# Patient Record
Sex: Male | Born: 1995 | Race: White | Hispanic: No | Marital: Single | Smoking: Never smoker
Health system: Southern US, Community
[De-identification: ages and names within clinical notes are randomized; demographics above are authoritative.]

## PROBLEM LIST (undated history)

## (undated) DIAGNOSIS — H539 Unspecified visual disturbance: Secondary | ICD-10-CM

## (undated) DIAGNOSIS — F909 Attention-deficit hyperactivity disorder, unspecified type: Secondary | ICD-10-CM

## (undated) DIAGNOSIS — R569 Unspecified convulsions: Secondary | ICD-10-CM

## (undated) HISTORY — PX: GASTROSTOMY TUBE PLACEMENT: SHX655

## (undated) HISTORY — DX: Unspecified convulsions: R56.9

## (undated) HISTORY — DX: Attention-deficit hyperactivity disorder, unspecified type: F90.9

## (undated) HISTORY — DX: Unspecified visual disturbance: H53.9

---
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
## 2016-01-05 ENCOUNTER — Other Ambulatory Visit: Payer: Self-pay | Admitting: Family

## 2016-01-05 DIAGNOSIS — G40319 Generalized idiopathic epilepsy and epileptic syndromes, intractable, without status epilepticus: Secondary | ICD-10-CM

## 2016-01-18 ENCOUNTER — Other Ambulatory Visit: Payer: Self-pay

## 2016-01-18 DIAGNOSIS — R6250 Unspecified lack of expected normal physiological development in childhood: Secondary | ICD-10-CM

## 2016-01-18 MED ORDER — LORAZEPAM 1 MG PO TABS: ORAL_TABLET | ORAL | 5 refills | Status: DC

## 2016-04-04 ENCOUNTER — Telehealth: Payer: Self-pay | Admitting: Pediatrics

## 2016-04-04 NOTE — Telephone Encounter (Signed)
[name] or [name] at [facility] needs to talk to you about a RX for Todd Murray please the number is [phone]

## 2016-04-18 NOTE — Telephone Encounter (Signed)
Failed Zantac/Nexium and omeprazole Unable to tolerate po--G tube dependant Unable to take capsules PA approved--[id number]--03/07/16 X 1 year

## 2016-04-22 ENCOUNTER — Telehealth: Payer: Self-pay | Admitting: Pediatrics

## 2016-04-22 ENCOUNTER — Telehealth (INDEPENDENT_AMBULATORY_CARE_PROVIDER_SITE_OTHER): Payer: Self-pay

## 2016-04-22 DIAGNOSIS — L03319 Cellulitis of trunk, unspecified: Principal | ICD-10-CM

## 2016-04-22 DIAGNOSIS — K9422 Gastrostomy infection: Secondary | ICD-10-CM

## 2016-04-22 MED ORDER — MUPIROCIN 2 % EX OINT: TOPICAL_OINTMENT | CUTANEOUS | 2 refills | Status: AC

## 2016-04-22 MED ORDER — CEPHALEXIN 250 MG/5ML PO SUSR: 500.0000 mg | Freq: Three times a day (TID) | ORAL | 0 refills | Status: AC

## 2016-04-22 NOTE — Addendum Note (Signed)
Addended by: Saul FordyceLOWE, CRYSTAL M on: 04/22/2016 04:39 PM   Modules accepted: Orders

## 2016-04-22 NOTE — Telephone Encounter (Signed)
[facility] home health nurse called today around 11:15 and left a message asking for a call back but did not say why.  I returned the call at 12:20 and had to leave a message.

## 2016-04-22 NOTE — Telephone Encounter (Signed)
I called Mom and told her that Dr Sharene SkeansHickling was out of the office today. I told her that I did not see a call from last week. I told her that I would write a letter and ask Dr Sharene SkeansHickling to sign it tomorrow, then let her know when it was ready for pick up. Mom agreed with this plan. TG

## 2016-04-22 NOTE — Telephone Encounter (Signed)
Mom called and spoke to RhodhissHeather, RN  about redness and now a pimple to area around G-tube. He has been having redness about 5 days but now there is a distinct pimple and seems to be very painful to touch. No fever and no other symptoms. Mom has been using bactroban ointment but not resolving. Looked at a picture and there appears to be some cellulitis with a small pulp abscess. Decided on starting keflex and continuing bactroban BID and have him seen by Peds surgery/Peds GI tomorrow. An appointment was made with Peds GI for tomorrow and at that visit if GI need to be consulted it can be done at that visit since they are in same office.  Mom was informed and plan and mom expressed understanding.

## 2016-04-22 NOTE — Telephone Encounter (Signed)
Rosalita ChessmanSuzanne, mom, lvm stating that she left a message for Dr. Rexene EdisonH last week regarding need for a letter. She said that she has a court date this Thursday and needs a letter to get guardianship established. She asked for a return call at: [phone].

## 2016-04-23 ENCOUNTER — Telehealth: Payer: Self-pay | Admitting: Pediatrics

## 2016-04-23 ENCOUNTER — Other Ambulatory Visit (INDEPENDENT_AMBULATORY_CARE_PROVIDER_SITE_OTHER): Payer: Self-pay | Admitting: Family

## 2016-04-23 ENCOUNTER — Ambulatory Visit (INDEPENDENT_AMBULATORY_CARE_PROVIDER_SITE_OTHER): Payer: Medicaid Other | Admitting: Surgery

## 2016-04-23 DIAGNOSIS — G40319 Generalized idiopathic epilepsy and epileptic syndromes, intractable, without status epilepticus: Secondary | ICD-10-CM

## 2016-04-23 MED ORDER — ZONEGRAN 25 MG PO CAPS: ORAL_CAPSULE | ORAL | 1 refills | Status: DC

## 2016-04-23 NOTE — Telephone Encounter (Signed)
I called Mom and let her know that the letter was ready to be picked up. TG

## 2016-04-23 NOTE — Telephone Encounter (Signed)
Todd SicklesGail Bliss called and they could not get transportation to the consult. As soon as the can get transportation they will reschedule the appt. She just wanted you to know.

## 2016-04-23 NOTE — Telephone Encounter (Signed)
Noted, thank you for composing the letter.

## 2016-04-30 ENCOUNTER — Telehealth (INDEPENDENT_AMBULATORY_CARE_PROVIDER_SITE_OTHER): Payer: Self-pay | Admitting: Family

## 2016-04-30 NOTE — Telephone Encounter (Signed)
Rosalita ChessmanSuzanne (mom) scheduled appt for May 20, 2016 at 11:15am with Dr Inetta Fermoina

## 2016-04-30 NOTE — Telephone Encounter (Signed)
-----   Message from Elveria Risingina Goodpasture, NP sent at 04/23/2016  3:12 PM EST ----- Regarding: Needs appointment Todd Murray needs an appointment with me in December.  Thanks,  Inetta Fermoina

## 2016-05-06 NOTE — Telephone Encounter (Signed)
Totally understand and will follow as outpatient.

## 2016-05-10 ENCOUNTER — Telehealth: Payer: Self-pay | Admitting: Pediatrics

## 2016-05-10 MED ORDER — SILVER SULFADIAZINE 1 % EX CREA: 1.0000 "application " | TOPICAL_CREAM | Freq: Every day | CUTANEOUS | 2 refills | Status: DC

## 2016-05-10 NOTE — Telephone Encounter (Signed)
Spoke to his nurse --[phone]--[phone] and since area is starting to get red again I called in Silverdene cream instead since his skin may be having a chemical burn --if not improved in a week or so then will start on clindamycin. Will follow up in a week

## 2016-05-15 ENCOUNTER — Telehealth (INDEPENDENT_AMBULATORY_CARE_PROVIDER_SITE_OTHER): Payer: Self-pay

## 2016-05-15 NOTE — Telephone Encounter (Signed)
I called Rosalita ChessmanSuzanne and lvm letting her know the letter was placed at the front for pick up. I included our office hours in the message.

## 2016-05-15 NOTE — Telephone Encounter (Signed)
Rosalita ChessmanSuzanne, mom, requesting a copy of the letter frpm 11.13.17 so that she may submit to [employer]. CB# [phone] I printed the letter and placed in Dr. Derwood KaplanH's office for signature.

## 2016-05-15 NOTE — Telephone Encounter (Signed)
Thank you, it is on your desk.

## 2016-05-17 ENCOUNTER — Telehealth: Payer: Self-pay | Admitting: Pediatrics

## 2016-05-17 ENCOUNTER — Other Ambulatory Visit: Payer: Self-pay | Admitting: Pediatrics

## 2016-05-17 MED ORDER — CLINDAMYCIN PALMITATE HCL 75 MG/5ML PO SOLR: 300.0000 mg | Freq: Three times a day (TID) | ORAL | 0 refills | Status: DC

## 2016-05-17 NOTE — Telephone Encounter (Signed)
Nurse called and said the G tube site is red again---will call in clindamycin and follow up in 48 hours

## 2016-05-17 NOTE — Telephone Encounter (Signed)
Home health RN called and reported that the G-Tube site is still looking red but not getting worse nor improving with the antibiotic cream.  She was checking to see if you wanted to switch to an oral antibiotic or continue treating with the cream until his appointment with you on May 29, 2017.    Please give mom a call to discuss.

## 2016-05-18 ENCOUNTER — Telehealth: Payer: Self-pay | Admitting: Pediatrics

## 2016-05-18 MED ORDER — CLINDAMYCIN PALMITATE HCL 75 MG/5ML PO SOLR: 300.0000 mg | Freq: Three times a day (TID) | ORAL | 0 refills | Status: DC

## 2016-05-18 NOTE — Telephone Encounter (Signed)
SPoke to mom and would call in clindamycin for use if the wound looks worse and for her to call me when she starts it for follow up

## 2016-05-20 ENCOUNTER — Ambulatory Visit (INDEPENDENT_AMBULATORY_CARE_PROVIDER_SITE_OTHER): Payer: Medicaid Other | Admitting: Family

## 2016-05-21 DIAGNOSIS — Z0271 Encounter for disability determination: Secondary | ICD-10-CM

## 2016-05-29 ENCOUNTER — Ambulatory Visit (INDEPENDENT_AMBULATORY_CARE_PROVIDER_SITE_OTHER): Payer: Medicaid Other | Admitting: Family

## 2016-05-29 ENCOUNTER — Ambulatory Visit (INDEPENDENT_AMBULATORY_CARE_PROVIDER_SITE_OTHER): Payer: Medicaid Other | Admitting: Pediatrics

## 2016-05-29 ENCOUNTER — Encounter (INDEPENDENT_AMBULATORY_CARE_PROVIDER_SITE_OTHER): Payer: Self-pay | Admitting: Family

## 2016-05-29 VITALS — BP 90/68 | Ht <= 58 in | Wt <= 1120 oz

## 2016-05-29 VITALS — BP 90/68 | HR 86 | Wt <= 1120 oz

## 2016-05-29 DIAGNOSIS — Q043 Other reduction deformities of brain: Secondary | ICD-10-CM

## 2016-05-29 DIAGNOSIS — Z68.41 Body mass index (BMI) pediatric, 5th percentile to less than 85th percentile for age: Secondary | ICD-10-CM | POA: Diagnosis not present

## 2016-05-29 DIAGNOSIS — M4143 Neuromuscular scoliosis, cervicothoracic region: Secondary | ICD-10-CM | POA: Diagnosis not present

## 2016-05-29 DIAGNOSIS — H47619 Cortical blindness, unspecified side of brain: Secondary | ICD-10-CM | POA: Diagnosis not present

## 2016-05-29 DIAGNOSIS — R6250 Unspecified lack of expected normal physiological development in childhood: Secondary | ICD-10-CM | POA: Diagnosis not present

## 2016-05-29 DIAGNOSIS — G253 Myoclonus: Secondary | ICD-10-CM | POA: Diagnosis not present

## 2016-05-29 DIAGNOSIS — G40319 Generalized idiopathic epilepsy and epileptic syndromes, intractable, without status epilepticus: Secondary | ICD-10-CM

## 2016-05-29 DIAGNOSIS — Z00121 Encounter for routine child health examination with abnormal findings: Secondary | ICD-10-CM | POA: Diagnosis not present

## 2016-05-29 DIAGNOSIS — Z23 Encounter for immunization: Secondary | ICD-10-CM

## 2016-05-29 NOTE — Progress Notes (Signed)
Patient: Todd Murray MRN: [id number] Sex: male DOB: 01/21/1996  Provider: Elveria Rising, NP Location of Care: [facility]  Note type: Routine return visit  History of Present Illness: Referral Source: Georgiann Hahn, MD History from: [facility] chart and parent Chief Complaint: Epilepsy  Todd Murray is a 20 y.o. 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 He was last seen May 29, 2015. 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 He is cared for at home by his parents and a nurse who comes in during the day. Todd Murray has a gastrostomy tube that is used to give him nourishment and medications.   Today his mother and the nurse with him today report that Todd Murray had not had increase in seizure activity. He has days in which he has more seizures than others, but overall the frequency and quality of the seizures has been unchanged. 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 He receives PRN blow by oxygen when he has respiratory distress or more severe seizures that cause pallor or cyanosis.   When he was last seen, Todd Murray was experiencing night time spasticity and we started him on Baclofen. His mother and his nurse report that this worked help him relax and sleep better at night.   He has been otherwise healthy since last seen and his mother has no other concerns about him today other than previously mentioned.  Review of Systems: Please see the HPI for neurologic and other pertinent review of systems. Otherwise, the following systems are noncontributory including constitutional, eyes, ears, nose and  throat, cardiovascular, respiratory, gastrointestinal, genitourinary, musculoskeletal, skin, endocrine, hematologic/lymph, allergic/immunologic and psychiatric.   Past Medical History:  Diagnosis Date  . ADHD (attention deficit hyperactivity disorder)   . Seizures (HCC)   . Vision abnormalities    Hospitalizations: No., Head Injury: No., Nervous System Infections: No., Immunizations up to date: Yes.   Past Medical History Comments: [name] has been on Dilantin since February 1999, Klonopin since December 1999, Zonegran since April 2001. 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  Surgical History Past Surgical History:  Procedure Laterality Date  . GASTROSTOMY TUBE PLACEMENT      Family History family history is not on file. Family History is otherwise negative for migraines, seizures, cognitive impairment, blindness, deafness, birth defects, chromosomal disorder, autism.  Social History Social History   Social History  . Marital status: Single    Spouse name: N/A  . Number of children: N/A  . Years of education: N/A   Social History Main Topics  . Smoking status: Never Smoker  . Smokeless tobacco: Never Used  . Alcohol use No  . Drug use: No  . Sexual activity: No   Other Topics Concern  . None   Social History Narrative   Todd Murray has a nurse aid that helps with his care. He has homebound services through [employer]. He lives with his mother and step-father.    Allergies Allergies  Allergen Reactions  . Lamictal [Lamotrigine]     Physical Exam BP 90/68   Pulse 86   Wt 65 lb (29.5 kg) Comment: Reported by  mother  BMI 15.67 kg/m  General: blond haired, blue eyed boy, reclining in wheelchair Head: normocephalic and atraumatic.  Neck: supple with no carotid or supraclavicular bruits. he has poor head  control Respiratory: lungs clear to auscultation in the bases but scattered rhonchi in the upper fields. He had occasional moist cough. I could not appreciate wheezing.  Cardiovascular: regular rate and rhythm, no murmurs.  Neurologic Exam  Mental Status: Profound cognitive impairment with no language. He does not respond to the examiner.  Cranial Nerves: Fundoscopic exam - red reflex present. Unable to fully visualize fundus. Pupils nonreactive to light. He has roving eye movements. Does not turn to localize sounds. Has mild lower facial weakness and drooling. Tongue does appear to be midline. Motor: Severe hypotonia with very minimal spontaneous movements. Hands are fisted. He has no contractures.  Sensory: Mild withdrawal x 4. Coordination: Unable to assess due to patient's inability to participate in examination. Gait and Station: Wheelchair bound Reflexes: Absent and symmetric. Toes downgoing. No clonus.  Impression 1. 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 Generalized convulsive epilepsy with intractable epilepsy 3. 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 Severe developmental delay 6. Cortical blindness 7. Severe dysphagia 8. Gastrostomy tube 9. Neuromuscular scoliosis  Recommendations for plan of care [facility]. Todd Murray has neither had nor required imaging or lab studies since the last visit.  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 He has intermittent seizures that are unchanged in  frequency. He will continue on his seizure medications without change for now. I asked his mother to call me if his seizure frequency changes or if they have any other concerns about his condition. I will see him back in follow up in 1 year or sooner if needed.   The medication list was reviewed and reconciled.  No changes were made in the prescribed medications today.  A complete medication list was provided to the patient/caregiver.  Allergies as of 05/29/2016      Reactions   Lamictal [lamotrigine]       Medication List       Accurate as of 05/29/16  6:39 PM. Always use your most recent med list.          acetaminophen 160 MG/5ML solution Commonly known as:  TYLENOL Place 15ml into g-tube every 4-6 hours as needed for pain or temperature greater than 100 axillary   albuterol (2.5 MG/3ML) 0.083% nebulizer solution Commonly known as:  PROVENTIL Take 3 mLs (2.5 mg total) by nebulization every 6 (six) hours as needed for wheezing or shortness of breath.   aluminum & magnesium hydroxide-simethicone 500-450-40 MG/5ML suspension Commonly known as:  MYLANTA Give 15-30 ml q 4-6hrs PRN reflux   AMBULATORY NON FORMULARY MEDICATION Medication Name: Baclofen 10mg  - compound to 1mg /ml.  Give 2ml at 4PM daily.   budesonide 0.5 MG/2ML nebulizer solution Commonly known as:  PULMICORT Take 2 mLs (0.5 mg total) by nebulization 2 (two) times daily.   cetirizine HCl 5 MG/5ML Syrp Commonly known as:  Zyrtec Give 10 ml in the morning and 5 ml in the evening via G-tube   clonazePAM 0.5 MG tablet Commonly known as:  KLONOPIN Give  Todd Murray 1/2 tablet via g-tube 4 times a day as directed.   DILANTIN INFATABS 50 MG tablet Generic drug:  phenytoin Give Todd Murray 1+1/2 tablets every morning & evening & give 1 tablet at lunch & dinner by g-tube daily   feeding supplement (PEDIASURE 1.0 CAL WITH FIBER) Liqd Give 9 ounces by g-tube 4 times per day   Glycerin (Laxative) 1 g Supp Commonly known as:   [employer] GLYCERIN CHILD 1 tab PRN 8 hours after prune juice   glycopyrrolate 2 MG tablet Commonly known as:  ROBINUL Crush 1 tablet & give by g-tube 4 times a day as directed.   ibuprofen 100 MG/5ML suspension Commonly known as:  CHILDRENS IBUPROFEN Place 15 mLs (300 mg total) into feeding tube as needed for fever. Place 15mls into g-tube every 4-6 hours as needed for pain or temperature greater than 100 axillary   lansoprazole 15 MG disintegrating tablet Commonly known as:  PREVACID SOLUTAB Take 1 tablet into feeding tube daily.   LORazepam 1 MG tablet Commonly known as:  ATIVAN Give Todd Murray 1 to 2 tablets via g-tube at bedtime as needed for restlessness.  May give 1 tablet every 4-6 hours as needed for seizures. Not to exceed 6mg /day   mupirocin ointment 2 % Commonly known as:  BACTROBAN Place 1 application into the nose 2 (two) times daily.   polyethylene glycol powder powder Commonly known as:  GLYCOLAX/MIRALAX Mix 8.5 GM in liquid of choice & give by g-tube once a day as directed. Hold if diarrhea. Give 17GM PRN if no BM in 2 days   Pseudoephedrine HCl 30 MG/5ML Liqd Commonly known as:  SUDAFED Give 10 ml q 4-6 hours PRN   ZONEGRAN 25 MG capsule Generic drug:  zonisamide Give Todd Murray 3 capsules by mouth twice a day as directed.       Total time spent with the patient was 25 minutes, of which 50% or more was spent in counseling and coordination of care.   Elveria Risingina Amberley Hamler NP-C

## 2016-05-29 NOTE — Patient Instructions (Signed)
I called [name] and was told that he was certified until November 2018.  I will send medication refills to [facility].   Please plan to return for follow up in December 2018 or sooner if needed.

## 2016-06-01 ENCOUNTER — Encounter: Payer: Self-pay | Admitting: Pediatrics

## 2016-06-01 DIAGNOSIS — Z23 Encounter for immunization: Secondary | ICD-10-CM | POA: Insufficient documentation

## 2016-06-01 DIAGNOSIS — Z68.41 Body mass index (BMI) pediatric, 5th percentile to less than 85th percentile for age: Secondary | ICD-10-CM | POA: Insufficient documentation

## 2016-06-01 DIAGNOSIS — Z0000 Encounter for general adult medical examination without abnormal findings: Secondary | ICD-10-CM | POA: Insufficient documentation

## 2016-06-01 NOTE — Patient Instructions (Signed)
Muscle Cramps and Spasms Muscle cramps and spasms occur when a muscle or muscles tighten and you have no control over this tightening (involuntary muscle contraction). They are a common problem and can develop in any muscle. The most common place is in the calf muscles of the leg. Both muscle cramps and muscle spasms are involuntary muscle contractions, but they also have differences:   Muscle cramps are sporadic and painful. They may last a few seconds to a quarter of an hour. Muscle cramps are often more forceful and last longer than muscle spasms.  Muscle spasms may or may not be painful. They may also last just a few seconds or much longer. CAUSES  It is uncommon for cramps or spasms to be due to a serious underlying problem. In many cases, the cause of cramps or spasms is unknown. Some common causes are:   Overexertion.   Overuse from repetitive motions (doing the same thing over and over).   Remaining in a certain position for a long period of time.   Improper preparation, form, or technique while performing a sport or activity.   Dehydration.   Injury.   Side effects of some medicines.   Abnormally low levels of the salts and ions in your blood (electrolytes), especially potassium and calcium. This could happen if you are taking water pills (diuretics) or you are pregnant.  Some underlying medical problems can make it more likely to develop cramps or spasms. These include, but are not limited to:   Diabetes.   Parkinson disease.   Hormone disorders, such as thyroid problems.   Alcohol abuse.   Diseases specific to muscles, joints, and bones.   Blood vessel disease where not enough blood is getting to the muscles.  HOME CARE INSTRUCTIONS   Stay well hydrated. Drink enough water and fluids to keep your urine clear or pale yellow.  It may be helpful to massage, stretch, and relax the affected muscle.  For tight or tense muscles, use a warm towel, heating  pad, or hot shower water directed to the affected area.  If you are sore or have pain after a cramp or spasm, applying ice to the affected area may relieve discomfort.  Put ice in a plastic bag.  Place a towel between your skin and the bag.  Leave the ice on for 15-20 minutes, 3-4 times a day.  Medicines used to treat a known cause of cramps or spasms may help reduce their frequency or severity. Only take over-the-counter or prescription medicines as directed by your caregiver. SEEK MEDICAL CARE IF:  Your cramps or spasms get more severe, more frequent, or do not improve over time.  MAKE SURE YOU:   Understand these instructions.  Will watch your condition.  Will get help right away if you are not doing well or get worse. This information is not intended to replace advice given to you by your health care provider. Make sure you discuss any questions you have with your health care provider. Document Released: 11/16/2001 Document Revised: 09/21/2012 Document Reviewed: 02/28/2015 Elsevier Interactive Patient Education  2017 Elsevier Inc.  

## 2016-06-01 NOTE — Progress Notes (Signed)
Adolescent Well Care Visit Todd Murray is a 20 y.o. male --with special needs who is here for well care. Joint visit with Peds neurology--being seen along with Todd Murray.    PCP:  Todd Murray, Todd Stiggers, MD   History was provided by the mother.  Current Issues: Current concerns include  G-tube dependent. Mickey botton Sinus drainage and increased secretions Suction at home--Hometown oxygen Nebulizer at home--albuterol and pulmicort--Aeroflow Wheelchair--Numotion Power bed and lift at home Portable oxygen and pulse ox at home   No new medications  G tubes site was infected but treated with oral keflex and now much improved.  Nutrition: G tube feedings  Exercise/ Media: N/A  Sleep:  Sleep: good  Social Screening: Developmental delay  Education: Developmental delay   Safe at home, in school & in relationships?  n/a Safe to self?n/a  Screenings: Patient has a dental home: yes  The patient completed the Rapid Assessment for Adolescent Preventive Services N/A  PHQ-9 completed and results indicated N/A  Physical Exam:  Vitals:   06/01/16 1943  BP: 90/68  Weight: 65 lb (29.5 kg)  Height: 4\' 7"  (1.397 m)   BP 90/68   Ht 4\' 7"  (1.397 m)   Wt 65 lb (29.5 kg)   BMI 15.11 kg/m  Body mass index: body mass index is 15.11 kg/m. Blood pressure percentiles are <1 % systolic and 31 % diastolic based on NHBPEP's 4th Report. Blood pressure percentile targets: 90: 131/89, 95: 135/93, 99 + 5 mmHg: 147/106.  No exam data present  General Appearance:   no distress  HENT: Normocephalic, no obvious abnormality, conjunctiva clear  Mouth:   Normal appearing teeth, no obvious discoloration, dental caries, or dental caps  Neck:   Supple; thyroid: no enlargement, symmetric, no tenderness/mass/nodules     Lungs:   Clear to auscultation bilaterally, normal work of breathing  Heart:   Regular rate and rhythm, S1 and S2 normal, no murmurs;   Abdomen:   Soft,  non-tender, no mass, or organomegaly  GU normal male genitals, no testicular masses or hernia  Musculoskeletal:   Wheelchair bound           Lymphatic:   No cervical adenopathy  Skin/Hair/Nails:   Skin warm, dry and intact, no rashes, no bruises or petechiae  Neurologic:   Baseline mental status     Assessment and Plan:   Adolescent visit    Counseling provided for all of the vaccine components  Orders Placed This Encounter  Procedures  . Flu Vaccine QUAD 36+ mos PF IM (Fluarix & Fluzone Quad PF)     Return in about 1 year (around 05/29/2017).Marland Kitchen.  Todd Murray, Todd Wooton, MD

## 2016-06-07 DIAGNOSIS — Z0271 Encounter for disability determination: Secondary | ICD-10-CM

## 2016-06-11 ENCOUNTER — Other Ambulatory Visit (INDEPENDENT_AMBULATORY_CARE_PROVIDER_SITE_OTHER): Payer: Self-pay | Admitting: Family

## 2016-06-11 DIAGNOSIS — M4143 Neuromuscular scoliosis, cervicothoracic region: Secondary | ICD-10-CM

## 2016-06-11 MED ORDER — AMBULATORY NON FORMULARY MEDICATION: 5 refills | Status: DC

## 2016-06-12 DIAGNOSIS — Z0279 Encounter for issue of other medical certificate: Secondary | ICD-10-CM

## 2016-07-01 ENCOUNTER — Other Ambulatory Visit (INDEPENDENT_AMBULATORY_CARE_PROVIDER_SITE_OTHER): Payer: Self-pay | Admitting: Family

## 2016-07-01 DIAGNOSIS — R6250 Unspecified lack of expected normal physiological development in childhood: Secondary | ICD-10-CM

## 2016-07-01 DIAGNOSIS — G40319 Generalized idiopathic epilepsy and epileptic syndromes, intractable, without status epilepticus: Secondary | ICD-10-CM

## 2016-07-01 DIAGNOSIS — Q043 Other reduction deformities of brain: Secondary | ICD-10-CM

## 2016-07-08 ENCOUNTER — Other Ambulatory Visit (INDEPENDENT_AMBULATORY_CARE_PROVIDER_SITE_OTHER): Payer: Self-pay | Admitting: Family

## 2016-07-08 DIAGNOSIS — G40319 Generalized idiopathic epilepsy and epileptic syndromes, intractable, without status epilepticus: Secondary | ICD-10-CM

## 2016-07-24 ENCOUNTER — Other Ambulatory Visit (INDEPENDENT_AMBULATORY_CARE_PROVIDER_SITE_OTHER): Payer: Self-pay | Admitting: Family

## 2016-07-24 DIAGNOSIS — G40319 Generalized idiopathic epilepsy and epileptic syndromes, intractable, without status epilepticus: Secondary | ICD-10-CM

## 2016-08-08 ENCOUNTER — Other Ambulatory Visit: Payer: Self-pay | Admitting: Pediatrics

## 2016-08-08 DIAGNOSIS — Q043 Other reduction deformities of brain: Secondary | ICD-10-CM

## 2016-08-08 DIAGNOSIS — R6250 Unspecified lack of expected normal physiological development in childhood: Secondary | ICD-10-CM

## 2016-12-04 ENCOUNTER — Other Ambulatory Visit (INDEPENDENT_AMBULATORY_CARE_PROVIDER_SITE_OTHER): Payer: Self-pay | Admitting: Family

## 2016-12-04 DIAGNOSIS — M4143 Neuromuscular scoliosis, cervicothoracic region: Secondary | ICD-10-CM

## 2016-12-04 MED ORDER — AMBULATORY NON FORMULARY MEDICATION: 5 refills | Status: DC

## 2017-01-01 ENCOUNTER — Other Ambulatory Visit: Payer: Self-pay | Admitting: Pediatrics

## 2017-01-01 DIAGNOSIS — G40319 Generalized idiopathic epilepsy and epileptic syndromes, intractable, without status epilepticus: Secondary | ICD-10-CM

## 2017-01-01 DIAGNOSIS — G253 Myoclonus: Secondary | ICD-10-CM

## 2017-01-01 DIAGNOSIS — R6250 Unspecified lack of expected normal physiological development in childhood: Secondary | ICD-10-CM

## 2017-01-22 ENCOUNTER — Other Ambulatory Visit (INDEPENDENT_AMBULATORY_CARE_PROVIDER_SITE_OTHER): Payer: Self-pay | Admitting: Family

## 2017-01-22 DIAGNOSIS — Q043 Other reduction deformities of brain: Secondary | ICD-10-CM

## 2017-01-22 DIAGNOSIS — R6250 Unspecified lack of expected normal physiological development in childhood: Secondary | ICD-10-CM

## 2017-01-22 DIAGNOSIS — G40319 Generalized idiopathic epilepsy and epileptic syndromes, intractable, without status epilepticus: Secondary | ICD-10-CM

## 2017-01-22 MED ORDER — CLONAZEPAM 0.5 MG PO TABS: ORAL_TABLET | ORAL | 5 refills | Status: DC

## 2017-02-25 ENCOUNTER — Other Ambulatory Visit (INDEPENDENT_AMBULATORY_CARE_PROVIDER_SITE_OTHER): Payer: Self-pay | Admitting: Family

## 2017-02-25 DIAGNOSIS — G40319 Generalized idiopathic epilepsy and epileptic syndromes, intractable, without status epilepticus: Secondary | ICD-10-CM

## 2017-02-25 MED ORDER — DILANTIN INFATABS 50 MG PO CHEW: CHEWABLE_TABLET | ORAL | 5 refills | Status: DC

## 2017-03-06 ENCOUNTER — Other Ambulatory Visit (INDEPENDENT_AMBULATORY_CARE_PROVIDER_SITE_OTHER): Payer: Self-pay | Admitting: Family

## 2017-03-06 DIAGNOSIS — G40319 Generalized idiopathic epilepsy and epileptic syndromes, intractable, without status epilepticus: Secondary | ICD-10-CM

## 2017-03-24 ENCOUNTER — Encounter: Payer: Self-pay | Admitting: Pediatrics

## 2017-04-07 ENCOUNTER — Telehealth: Payer: Self-pay | Admitting: Pediatrics

## 2017-04-07 NOTE — Telephone Encounter (Signed)
Mother states [facility] needs a pre authorization for ins for Previcid . Can speak to GardiBetsy at pharmacy if needed

## 2017-04-10 NOTE — Telephone Encounter (Signed)
PA obtained Number [id number] PA auth # [id number]

## 2017-04-11 ENCOUNTER — Telehealth: Payer: Self-pay | Admitting: Pediatrics

## 2017-04-11 NOTE — Telephone Encounter (Signed)
Todd Murray's home helth nurse Todd Bering([place]) called for verbal order to discontinue his prevacid. Due to Medicaid, there have been problems getting the medication filled. She reports that he has not had the medication in 3 weeks and is not showing an signs or symptoms of reflux. Verbal order to discontinue Prevacid given to home nurse.

## 2017-04-28 ENCOUNTER — Encounter: Payer: Self-pay | Admitting: Pediatrics

## 2017-04-29 ENCOUNTER — Telehealth (INDEPENDENT_AMBULATORY_CARE_PROVIDER_SITE_OTHER): Payer: Self-pay

## 2017-04-29 DIAGNOSIS — R6250 Unspecified lack of expected normal physiological development in childhood: Secondary | ICD-10-CM

## 2017-04-29 MED ORDER — LORAZEPAM 1 MG PO TABS: ORAL_TABLET | ORAL | 5 refills | Status: DC

## 2017-04-29 NOTE — Telephone Encounter (Signed)
Rx has been printed and placed on Tina's desk 

## 2017-04-30 ENCOUNTER — Telehealth (INDEPENDENT_AMBULATORY_CARE_PROVIDER_SITE_OTHER): Payer: Self-pay | Admitting: Pediatrics

## 2017-04-30 DIAGNOSIS — G40319 Generalized idiopathic epilepsy and epileptic syndromes, intractable, without status epilepticus: Secondary | ICD-10-CM

## 2017-04-30 NOTE — Telephone Encounter (Signed)
°  Who's calling (name and relationship to patient) : Rosalita ChessmanSuzanne (mom) Best contact number: [phone] Provider they see: Sharene SkeansHickling Reason for call: Mom stated that the pharmacy said they cannot fill the Zonegran anymore. It was too expensive for them to order.  The office need to send a new Rx for the generic brand.  Please call.     PRESCRIPTION REFILL ONLY  Name of prescription:  Pharmacy:

## 2017-05-05 NOTE — Telephone Encounter (Signed)
I called Mom Todd Murray and left a message asking for Mom to call me back. TG

## 2017-05-06 MED ORDER — ZONEGRAN 25 MG PO CAPS: ORAL_CAPSULE | ORAL | 3 refills | Status: DC

## 2017-05-06 NOTE — Telephone Encounter (Signed)
Thanks for taking care of that.  I agree with this plan.

## 2017-05-06 NOTE — Telephone Encounter (Signed)
I called and talked to Todd Murray. She said that she had talked to Dr Sharene SkeansHickling last week about this problem. [facility] told her that they can no longer afford to order [name]. I talked to Todd Murray about this and about switching to generic. I told her that since Todd Murray has been stable on brand for so long that I am reluctant to change to generic at this time. Todd Murray agreed and asked [facility] [address]. Todd Murray also said that Todd Murray will be losing his nursing care when he turns 22 next year and she is worried about what to do at that point. I told her that I would talk with the [facility] nurse and see if there are options that I do not know about. I also told her that we can probably change his medications to BID so that if he only has a CNA instead of a nurse at that point, Todd Murray can administer the medications. We will talk further about this at his December visit. Todd Murray agreed with these plans. TG

## 2017-05-26 ENCOUNTER — Other Ambulatory Visit: Payer: Self-pay

## 2017-05-26 ENCOUNTER — Encounter (INDEPENDENT_AMBULATORY_CARE_PROVIDER_SITE_OTHER): Payer: Self-pay | Admitting: Family

## 2017-05-26 ENCOUNTER — Ambulatory Visit (INDEPENDENT_AMBULATORY_CARE_PROVIDER_SITE_OTHER): Payer: Self-pay | Admitting: Family

## 2017-05-26 ENCOUNTER — Ambulatory Visit (INDEPENDENT_AMBULATORY_CARE_PROVIDER_SITE_OTHER): Payer: Medicaid Other | Admitting: Family

## 2017-05-26 ENCOUNTER — Ambulatory Visit (INDEPENDENT_AMBULATORY_CARE_PROVIDER_SITE_OTHER): Payer: Medicaid Other | Admitting: Pediatrics

## 2017-05-26 VITALS — BP 90/60 | HR 88 | Resp 22 | Wt <= 1120 oz

## 2017-05-26 VITALS — BP 90/60 | Ht <= 58 in | Wt <= 1120 oz

## 2017-05-26 DIAGNOSIS — H47619 Cortical blindness, unspecified side of brain: Secondary | ICD-10-CM

## 2017-05-26 DIAGNOSIS — R6250 Unspecified lack of expected normal physiological development in childhood: Secondary | ICD-10-CM

## 2017-05-26 DIAGNOSIS — G40319 Generalized idiopathic epilepsy and epileptic syndromes, intractable, without status epilepticus: Secondary | ICD-10-CM

## 2017-05-26 DIAGNOSIS — Z931 Gastrostomy status: Secondary | ICD-10-CM

## 2017-05-26 DIAGNOSIS — M4143 Neuromuscular scoliosis, cervicothoracic region: Secondary | ICD-10-CM

## 2017-05-26 DIAGNOSIS — Q043 Other reduction deformities of brain: Secondary | ICD-10-CM

## 2017-05-26 DIAGNOSIS — Z23 Encounter for immunization: Secondary | ICD-10-CM

## 2017-05-26 DIAGNOSIS — G253 Myoclonus: Secondary | ICD-10-CM

## 2017-05-26 DIAGNOSIS — Z00121 Encounter for routine child health examination with abnormal findings: Secondary | ICD-10-CM | POA: Diagnosis not present

## 2017-05-26 NOTE — Progress Notes (Signed)
Patient: Todd Murray MRN: [id number] Sex: male DOB: [birth year]  Provider: Elveria Risingina Loriene Taunton, NP [facility]  Note type: Routine return visit  History of Present Illness: Referral Source: Georgiann HahnAndres Ramgoolam, MD History from: mother and aide and [facility] chart Chief Complaint: Epilepsy  Todd Nighticolas R Salvino is a 21 y.o. 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 He was last seen May 29, 2016. 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 He is cared for at home by his mother and a nurse who comes in during the day. Todd Murray has a gastrostomy tube for nourishment and medications.   Today Todd Murray' mother and his nurse report that he typically has 2 to 3 seizures per week, but on December 5th he had 4 or 5 motor seizures in one day. This occurred on the day prior to a snowstorm and his mother wonders if the weather change triggered the seizures. 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 When he has seizures, he requires blow by oxygen to recover. He also receives the blow by oxygen at times when he has mild respiratory distress, pallor or cyanosis.   His mother is concerned today because Todd Murray will age out of his CAP-C benefits when he turns [age] in August 2019. He requires nursing care during the day and Mom is unable to afford nursing care on her own.   Todd Murray has been otherwise healthy since he was last seen and Mom has no other health concerns for Todd Murray today other than previously mentioned.  Review of Systems: Please see the HPI for neurologic and other pertinent review of systems. Otherwise, all other systems were reviewed  and were negative.    Past Medical History:  Diagnosis Date  . ADHD (attention deficit hyperactivity disorder)   . Seizures (HCC)   . Vision abnormalities    Hospitalizations: No., Head Injury: No., Nervous System Infections: No., Immunizations up to date: Yes.   Past Medical History Comments: Todd Murray has been on Dilantin since February 1999, Klonopin since December 1999, Zonegran since April 2001. 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  Surgical History Past Surgical History:  Procedure Laterality Date  . GASTROSTOMY TUBE PLACEMENT      Family History family history is not on file. Family History is otherwise negative for migraines, seizures, cognitive impairment, blindness, deafness, birth defects, chromosomal disorder, autism.  Social History Social History   Socioeconomic History  . Marital status: Single    Spouse name: None  . Number of children: None  . Years of education: None  . Highest education level: None  Social Needs  . Financial resource strain: None  . Food insecurity - worry: None  . Food insecurity - inability: None  . Transportation needs - medical: None  . Transportation needs - non-medical: None  Occupational History  . None  Tobacco Use  . Smoking status: Never Smoker  . Smokeless tobacco: Never Used  Substance and Sexual Activity  . Alcohol use: No  . Drug use: No  . Sexual activity: No  Other Topics Concern  . None  Social History Narrative   Todd Murray has a Firefighternurse aid  that helps with his care. He has homebound services through [facility]. He lives with his mother and step-father.    Allergies Allergies  Allergen Reactions  . Lamictal [Lamotrigine]     Physical Exam BP 90/60   Pulse 88   Resp (!) 22   Wt 63 lb 3.2 oz (28.7 kg)   BMI 14.69 kg/m  General: small statured male,  reclining in wheelchair, in no evident distress; blond hair, blue eyes, in no evident distress. Head: normocephalic and atraumatic. Oropharynx benign. No dysmorphic features. Neck: supple with no carotid bruits, no head control Cardiovascular: regular rate and rhythm, no murmurs. Respiratory: Clear to auscultation bilaterally Abdomen: Bowel sounds present all four quadrants, abdomen soft, non-tender, non-distended. No hepatosplenomegaly or masses palpated. Skin: no rashes or neurocutaneous lesions  Neurologic Exam Mental Status: Profound cognitive impairment with no language. He does not respond to the examiner Cranial Nerves: Fundoscopic exam - red reflex present.  Unable to fully visualize fundus.  Pupils nonresponsive to light. He has roving eye movements. Startles to sounds in the periphery. He has lower facial weakness with drooling. Tongue does not appear to be midline. Motor: Severe hypotonia with very minimal spontaneous movements. Hands are fisted.  Sensory: Mild withdrawal x 4. Coordination: Unable to assess due to patient's inability to cooperate. Gait and Station: Unable to stand or bear weight. Reflexes: Absent and symmetric. Toes downgoing. No clonus.  Impression 1.  Congenital lissencephaly, primarily frontal lobes with pachygyria posteriorly 2.  Generalized convulsive epilepsy with intractable epilepsy 3.  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  Severe developmental delay 6.  Cortical blindness 7.  Severe dysphagia 8.  Gastrostomy tube 9.  Neuromuscular scoliosis  Recommendations for plan of care [facility]. Todd Murray has neither had nor required imaging or lab studies since the last visit. 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 He is taking and tolerating  Dilantin, clonazepam, and Zonegran for his seizure disorder and will continue on these medications without change for now. I talked with his mother about him aging out of the CAP-C program this year and encouraged her to start the application process for CAP-D. I asked her to call me if she has any questions or concerns about Todd Murray. I will otherwise see him back in follow up in 1 year or sooner if needed. Mom agreed with the plans made today.   The medication list was reviewed and reconciled.  No changes were made in the prescribed medications today.  A complete medication list was provided to the patient's mother.  Allergies as of 05/26/2017      Reactions   Lamictal [lamotrigine]       Medication List        Accurate as of 05/26/17 11:59 PM. Always use your most recent med list.          acetaminophen 160 MG/5ML solution Commonly known as:  TYLENOL Place 15ml into g-tube every 4-6 hours as needed for pain or temperature greater than 100 axillary   albuterol (2.5 MG/3ML) 0.083% nebulizer solution Commonly known as:  PROVENTIL Take 3 mLs (2.5 mg total) by nebulization every 6 (six) hours as needed for wheezing or shortness of breath.   aluminum & magnesium hydroxide-simethicone 500-450-40 MG/5ML suspension Commonly known as:  MYLANTA Give 15-30 ml q 4-6hrs PRN reflux   AMBULATORY NON FORMULARY MEDICATION Medication Name: Baclofen 10mg  -  compound to 1mg /ml.  Give 2ml at 4PM daily.   budesonide 0.5 MG/2ML nebulizer solution Commonly known as:  PULMICORT Take 2 mLs (0.5 mg total) by nebulization 2 (two) times daily.   cetirizine HCl 5 MG/5ML Syrp Commonly known as:  Zyrtec Give 10 ml in the morning and 5 ml in the evening via G-tube   clonazePAM 0.5 MG tablet Commonly known as:  Kennon Rounds 1/2 (one half) tablet via g-tube 4 times per day as directed   DILANTIN INFATABS 50 MG tablet Generic drug:  phenytoin Give Nicholas 1 & 12 tablets every morning & evening &  give 1 tablet at lunch & dinner every day via gtube as directed.   feeding supplement (PEDIASURE 1.0 CAL WITH FIBER) Liqd Give 9 ounces by g-tube 4 times per day   Glycerin (Laxative) 1 g Supp Commonly known as:  [employer] GLYCERIN CHILD 1 tab PRN 8 hours after prune juice   glycopyrrolate 2 MG tablet Commonly known as:  ROBINUL Crush 1 tablet & give by gtube 4 times a day as directed.   ibuprofen 100 MG/5ML suspension Commonly known as:  CHILDRENS IBUPROFEN Place 15 mLs (300 mg total) into feeding tube as needed for fever. Place into g-tube every 4-6 hours as needed for pain or temperature greater than 100 axillary   lansoprazole 15 MG disintegrating tablet Commonly known as:  PREVACID SOLUTAB Take 1 tablet into feeding tube daily.   LORazepam 1 MG tablet Commonly known as:  ATIVAN Give Alann 1 to 2 tablets via g-tube at bedtime as needed for restlessness.  May give 1 tablet every 4-6 hours as needed for seizures. Not to exceed 6mg /day   mupirocin ointment 2 % Commonly known as:  BACTROBAN Place 1 application into the nose 2 (two) times daily.   polyethylene glycol powder powder Commonly known as:  GLYCOLAX/MIRALAX Mix 8.5 gm in liquid of choice & give by gtube once a day as directed. hold if diarrhea. give 17gm as needed if no bowel movement in 2 days.   Pseudoephedrine HCl 30 MG/5ML Liqd Commonly known as:  SUDAFED Give 10 ml q 4-6 hours PRN   ZONEGRAN 25 MG capsule Generic drug:  zonisamide Give Nicholas 3 capsules by mouth twice a day as directed.       Total time spent with the patient was 25 minutes, of which 50% or more was spent in counseling and coordination of care.   Elveria Rising NP-C

## 2017-05-26 NOTE — Patient Instructions (Signed)
Thank you for coming in today.   Instructions for you until your next appointment are as follows: 1. Continue Todd Murray' medications as you have been giving them. 2. Let me know if his seizures become more frequent or more severe.   3. Because he is going to be 22 next year, you will need to apply for a different Medicaid program for him called CAP-D, to provide services that he gets now. The person to contact is Laurian BrimJasmine Buckston at [facility]. Her phone # is [phone]. I would recommend calling soon as there is an application process for the CAP-D program.  4. If you are concerned about Todd Murray and would like for me to visit him at home sometime, please call or text me at [phone], and I will be happy to do a home visit.  5. Otherwise, I will see him back in follow up in 1 year. We will plan a joint visit again with Dr Ardyth Manam at that time.

## 2017-05-26 NOTE — Patient Instructions (Signed)
Seizure, Adult °When you have a seizure: °· Parts of your body may move. °· How aware or awake (conscious) you are may change. °· You may shake (convulse). ° °Some people have symptoms right before a seizure happens. These symptoms may include: °· Fear. °· Worry (anxiety). °· Feeling like you are going to throw up (nausea). °· Feeling like the room is spinning (vertigo). °· Feeling like you saw or heard something before (deja vu). °· Odd tastes or smells. °· Changes in vision, such as seeing flashing lights or spots. ° °Seizures usually last from 30 seconds to 2 minutes. Usually, they are not harmful unless they last a long time. °Follow these instructions at home: °Medicines °· Take over-the-counter and prescription medicines only as told by your doctor. °· Avoid anything that may keep your medicine from working, such as alcohol. °Activity °· Do not do any activities that would be dangerous if you had another seizure, like driving or swimming. Wait until your doctor approves. °· If you live in the [place] ask your local DMV (department of motor vehicles) when you can drive. °· Rest. °Teaching others °· Teach friends and family what to do when you have a seizure. They should: °? Lay you on the ground. °? Protect your head and body. °? Loosen any tight clothing around your neck. °? Turn you on your side. °? Stay with you until you are better. °? Not hold you down. °? Not put anything in your mouth. °? Know whether or not you need emergency care. °General instructions °· Contact your doctor each time you have a seizure. °· Avoid anything that gives you seizures. °· Keep a seizure diary. Write down: °? What you think caused each seizure. °? What you remember about each seizure. °· Keep all follow-up visits as told by your doctor. This is important. °Contact a doctor if: °· You have another seizure. °· You have seizures more often. °· There is any change in what happens during your seizures. °· You continue to have  seizures with treatment. °· You have symptoms of being sick or having an infection. °Get help right away if: °· You have a seizure: °? That lasts longer than 5 minutes. °? That is different than seizures you had before. °? That makes it harder to breathe. °? After you hurt your head. °· After a seizure, you cannot speak or use a part of your body. °· After a seizure, you are confused or have a bad headache. °· You have two or more seizures in a row. °· You are having seizures more often. °· You do not wake up right after a seizure. °· You get hurt during a seizure. °In an emergency: °· These symptoms may be an emergency. Do not wait to see if the symptoms will go away. Get medical help right away. Call your local emergency services (911 in the [place] Do not drive yourself to the hospital. °This information is not intended to replace advice given to you by your health care provider. Make sure you discuss any questions you have with your health care provider. °Document Released: 11/13/2007 Document Revised: 02/07/2016 Document Reviewed: 02/07/2016 °Elsevier Interactive Patient Education © 2017 Elsevier Inc. ° °

## 2017-05-26 NOTE — Progress Notes (Signed)
History of Present Illness  Main concerns today are: Increased tone in lower extremities from underlying cerebral palsy.    Developmental History Developmental delay  Function: Mobility: manual wheelchair Pain concerns: no Hand function:  Right: reduced dexterity  Left: reduced dexterity Spine curvature: mild Swallowing: normal and modified diet (pureed) Toileting: dependent   Equipment:   Insurance claims handlerWheeelchair Bath chair Suction macine Oxygen concentrator Pulse ox monitor G tubes--mickey 18 F 2.5 cm --extensions syrunges Diapers  And underpants--gloves and wipes Nebulizer Nursing care   Review of Systems: Vision: impaired Hearing: impaired Seizures: yes - controlled Constipation: no GE reflux: yes - followed by GI Fractures: no  The following portions of the patient's history were reviewed and updated as appropriate: allergies, current medications, past family history, past medical history, past social history, past surgical history and problem list.  Referrals needed-- None at present  Objective:    Physical Exam Wt 63lb 14.4 oz HT---55in  Cognition: non-interactive Respiratory: normal, no increased effort Lower extremity function:  Increased tone with contractures---in wheelchair Actively in wheelchair today   Abdomen: normal-- G tube present Spine scoliosis: moderate Sitting Ability: assisted Gait: wheelchair    Assessment:    developmental delay Cerebral palsy with spasticity    Plan:    1. Gross motor: delayed 2. Fine motor/ADL: delayd 3. Educational/vocational: N/A 4. Transition skills: n/a 5. Speech/swallowing: no speech, GERD 6. Orthopedics/bracing: none 7. Other equipment:  bath chair, gait trainer, hand/wrist splint(s), life system, stander, walker, wheelchair and RAMP for house. 8. Fu vaccine given after counseling

## 2017-05-27 ENCOUNTER — Encounter: Payer: Self-pay | Admitting: Pediatrics

## 2017-05-29 ENCOUNTER — Other Ambulatory Visit (INDEPENDENT_AMBULATORY_CARE_PROVIDER_SITE_OTHER): Payer: Self-pay

## 2017-05-29 ENCOUNTER — Telehealth (INDEPENDENT_AMBULATORY_CARE_PROVIDER_SITE_OTHER): Payer: Self-pay

## 2017-05-29 ENCOUNTER — Encounter (INDEPENDENT_AMBULATORY_CARE_PROVIDER_SITE_OTHER): Payer: Self-pay | Admitting: Family

## 2017-05-29 DIAGNOSIS — M4143 Neuromuscular scoliosis, cervicothoracic region: Secondary | ICD-10-CM

## 2017-05-29 MED ORDER — AMBULATORY NON FORMULARY MEDICATION: 5 refills | Status: DC

## 2017-05-29 NOTE — Telephone Encounter (Signed)
I have reprinted the rx and placed it on Todd Murray's desk. I have no idea where the refusal came from

## 2017-05-29 NOTE — Telephone Encounter (Signed)
  Who's calling (name and relationship to patient) : [facility]   Best contact number: [phone]  Provider they see:  Inetta Fermoina   Reason for call: calling in regards to Rx that was sent over last week for baclofen 10 mg. They received a refusal and were not sure why.     PRESCRIPTION REFILL ONLY  Name of prescription: Baclofen 10 mg   Pharmacy: [facility]

## 2017-05-29 NOTE — Telephone Encounter (Signed)
Rx has been faxed to [facility]

## 2017-06-25 ENCOUNTER — Encounter: Payer: Self-pay | Admitting: Pediatrics

## 2017-06-26 ENCOUNTER — Encounter: Payer: Self-pay | Admitting: Pediatrics

## 2017-07-18 ENCOUNTER — Encounter: Payer: Self-pay | Admitting: Pediatrics

## 2017-08-04 ENCOUNTER — Telehealth (INDEPENDENT_AMBULATORY_CARE_PROVIDER_SITE_OTHER): Payer: Self-pay | Admitting: Family

## 2017-08-04 DIAGNOSIS — G40319 Generalized idiopathic epilepsy and epileptic syndromes, intractable, without status epilepticus: Secondary | ICD-10-CM

## 2017-08-04 MED ORDER — ZONISAMIDE 25 MG PO CAPS: ORAL_CAPSULE | ORAL | 3 refills | Status: DC

## 2017-08-04 NOTE — Telephone Encounter (Signed)
Dr. Sharene SkeansHickling, we just need to do a rx for Zonisamide. Medicaid will not pay for the brand anymore

## 2017-08-04 NOTE — Telephone Encounter (Signed)
°  Who's calling (name and relationship to patient) : Rosalita ChessmanSuzanne (Mother) Best contact number: [phone] Provider they see: Inetta Fermoina  Reason for call: Per mom, pharm stated they can no longer fill pt's Zonegram. Please call mom. Pt will need an alternative rx as she is out of medication today.

## 2017-08-04 NOTE — Telephone Encounter (Signed)
I spoke with mother to give her the information.

## 2017-08-12 ENCOUNTER — Other Ambulatory Visit (INDEPENDENT_AMBULATORY_CARE_PROVIDER_SITE_OTHER): Payer: Self-pay | Admitting: Family

## 2017-08-12 DIAGNOSIS — G40319 Generalized idiopathic epilepsy and epileptic syndromes, intractable, without status epilepticus: Secondary | ICD-10-CM

## 2017-08-12 DIAGNOSIS — R6250 Unspecified lack of expected normal physiological development in childhood: Secondary | ICD-10-CM

## 2017-08-12 DIAGNOSIS — Q043 Other reduction deformities of brain: Secondary | ICD-10-CM

## 2017-08-12 MED ORDER — CLONAZEPAM 0.5 MG PO TABS: ORAL_TABLET | ORAL | 5 refills | Status: DC

## 2017-08-12 NOTE — Telephone Encounter (Signed)
Rx has been printed and placed on Tina's desk 

## 2017-08-12 NOTE — Telephone Encounter (Signed)
Rx has been faxed to the pharmacy 

## 2017-08-20 ENCOUNTER — Other Ambulatory Visit: Payer: Self-pay | Admitting: Pediatrics

## 2017-08-20 DIAGNOSIS — R6250 Unspecified lack of expected normal physiological development in childhood: Secondary | ICD-10-CM

## 2017-08-20 DIAGNOSIS — Q043 Other reduction deformities of brain: Secondary | ICD-10-CM

## 2017-09-05 ENCOUNTER — Telehealth: Payer: Self-pay | Admitting: Pediatrics

## 2017-09-05 MED ORDER — AMOXICILLIN-POT CLAVULANATE 600-42.9 MG/5ML PO SUSR: 600.0000 mg | Freq: Two times a day (BID) | ORAL | 0 refills | Status: AC

## 2017-09-05 NOTE — Telephone Encounter (Signed)
Nurse called and says he has green nasal discharge and congestion--will increase zyrtec to twice a day and start on augmentin ES 600 mg BID X 10 days and follow as needed.

## 2017-09-24 ENCOUNTER — Other Ambulatory Visit (INDEPENDENT_AMBULATORY_CARE_PROVIDER_SITE_OTHER): Payer: Self-pay | Admitting: Family

## 2017-09-24 DIAGNOSIS — G40319 Generalized idiopathic epilepsy and epileptic syndromes, intractable, without status epilepticus: Secondary | ICD-10-CM

## 2017-12-02 ENCOUNTER — Other Ambulatory Visit (INDEPENDENT_AMBULATORY_CARE_PROVIDER_SITE_OTHER): Payer: Self-pay | Admitting: Family

## 2017-12-02 DIAGNOSIS — M4143 Neuromuscular scoliosis, cervicothoracic region: Secondary | ICD-10-CM

## 2017-12-02 MED ORDER — AMBULATORY NON FORMULARY MEDICATION: 5 refills | Status: DC

## 2017-12-16 ENCOUNTER — Other Ambulatory Visit (INDEPENDENT_AMBULATORY_CARE_PROVIDER_SITE_OTHER): Payer: Self-pay | Admitting: Family

## 2017-12-16 DIAGNOSIS — R6250 Unspecified lack of expected normal physiological development in childhood: Secondary | ICD-10-CM

## 2017-12-16 MED ORDER — LORAZEPAM 1 MG PO TABS: ORAL_TABLET | ORAL | 5 refills | Status: DC

## 2017-12-24 ENCOUNTER — Telehealth (INDEPENDENT_AMBULATORY_CARE_PROVIDER_SITE_OTHER): Payer: Self-pay | Admitting: Family

## 2017-12-24 DIAGNOSIS — G40319 Generalized idiopathic epilepsy and epileptic syndromes, intractable, without status epilepticus: Secondary | ICD-10-CM

## 2017-12-24 MED ORDER — ZONISAMIDE 25 MG PO CAPS: ORAL_CAPSULE | ORAL | 3 refills | Status: DC

## 2017-12-24 NOTE — Telephone Encounter (Signed)
Rx has been electronically sent to the pharmacy 

## 2018-01-02 ENCOUNTER — Telehealth: Payer: Self-pay | Admitting: Pediatrics

## 2018-01-02 MED ORDER — MUPIROCIN 2 % EX OINT: TOPICAL_OINTMENT | CUTANEOUS | 2 refills | Status: AC

## 2018-01-02 NOTE — Telephone Encounter (Signed)
Joyce GrossKay who is the care nurse that works with patient at home called stating they noticed yesterday evening some redness and irritation around the stoma area. Kay cleaned the site with warm water and soap. Joyce GrossKay states it is still red this morning. Mom is out of town and requested bactroban called into pharmacy. Per Calla KicksLynn Klett, CPNP will would send something into pharmacy.

## 2018-01-02 NOTE — Telephone Encounter (Signed)
Called in bactroban to [facility]

## 2018-01-05 ENCOUNTER — Telehealth: Payer: Self-pay | Admitting: Pediatrics

## 2018-01-05 NOTE — Telephone Encounter (Signed)
Todd Murray, [name] nurse needs Dr Barney Drainamgoolam to give her a call concerning Todd Murray' medication orders. [name] stated there was a conflict in [name]' medication order

## 2018-01-07 NOTE — Telephone Encounter (Signed)
Gave verbal order to nurse--will sign orders when available.

## 2018-02-03 ENCOUNTER — Telehealth: Payer: Self-pay | Admitting: Pediatrics

## 2018-02-03 DIAGNOSIS — Q043 Other reduction deformities of brain: Secondary | ICD-10-CM

## 2018-02-03 NOTE — Telephone Encounter (Signed)
Needs a letter to [facility] for pull ups and under pads faxed to [phone] Attention Elon JesterMichele

## 2018-02-05 NOTE — Telephone Encounter (Signed)
Letter written to [facility]

## 2018-02-12 ENCOUNTER — Telehealth: Payer: Self-pay | Admitting: Pediatrics

## 2018-02-12 NOTE — Telephone Encounter (Signed)
[name] Needs a letter of medical necessity for support on incontinent supplies fax to [phone] please

## 2018-02-17 ENCOUNTER — Encounter: Payer: Self-pay | Admitting: Pediatrics

## 2018-02-17 NOTE — Telephone Encounter (Signed)
Letter for incontinent supplies written

## 2018-03-02 ENCOUNTER — Telehealth (INDEPENDENT_AMBULATORY_CARE_PROVIDER_SITE_OTHER): Payer: Self-pay | Admitting: Family

## 2018-03-02 DIAGNOSIS — R6250 Unspecified lack of expected normal physiological development in childhood: Secondary | ICD-10-CM

## 2018-03-02 DIAGNOSIS — G40319 Generalized idiopathic epilepsy and epileptic syndromes, intractable, without status epilepticus: Secondary | ICD-10-CM

## 2018-03-02 DIAGNOSIS — Q043 Other reduction deformities of brain: Secondary | ICD-10-CM

## 2018-03-02 MED ORDER — CLONAZEPAM 0.5 MG PO TABS: ORAL_TABLET | ORAL | 5 refills | Status: DC

## 2018-03-03 ENCOUNTER — Encounter: Payer: Self-pay | Admitting: Pediatrics

## 2018-03-03 NOTE — Telephone Encounter (Signed)
Rx has been sent to the pharmacy

## 2018-03-06 ENCOUNTER — Encounter: Payer: Self-pay | Admitting: Pediatrics

## 2018-03-26 ENCOUNTER — Encounter: Payer: Self-pay | Admitting: Pediatrics

## 2018-05-18 ENCOUNTER — Other Ambulatory Visit (INDEPENDENT_AMBULATORY_CARE_PROVIDER_SITE_OTHER): Payer: Self-pay | Admitting: Family

## 2018-05-18 DIAGNOSIS — G40319 Generalized idiopathic epilepsy and epileptic syndromes, intractable, without status epilepticus: Secondary | ICD-10-CM

## 2018-05-18 NOTE — Telephone Encounter (Signed)
Todd Murray the refills in this patient's chart says 98. Is that right?

## 2018-05-19 MED ORDER — DILANTIN INFATABS 50 MG PO CHEW: CHEWABLE_TABLET | ORAL | 0 refills | Status: DC

## 2018-06-12 ENCOUNTER — Telehealth: Payer: Self-pay | Admitting: Pediatrics

## 2018-06-12 NOTE — Telephone Encounter (Signed)
Mother would like you to bring a flu shot when you meet with them on Monday . Also , she wants to know if you can change G tube while you're there. States she has never done it and doesn't feel comfortable

## 2018-06-13 ENCOUNTER — Other Ambulatory Visit (INDEPENDENT_AMBULATORY_CARE_PROVIDER_SITE_OTHER): Payer: Self-pay | Admitting: Family

## 2018-06-13 DIAGNOSIS — G40319 Generalized idiopathic epilepsy and epileptic syndromes, intractable, without status epilepticus: Secondary | ICD-10-CM

## 2018-06-15 ENCOUNTER — Encounter: Payer: Self-pay | Admitting: Pediatrics

## 2018-06-15 ENCOUNTER — Ambulatory Visit (INDEPENDENT_AMBULATORY_CARE_PROVIDER_SITE_OTHER): Payer: Medicaid Other | Admitting: Family

## 2018-06-15 ENCOUNTER — Encounter (INDEPENDENT_AMBULATORY_CARE_PROVIDER_SITE_OTHER): Payer: Self-pay | Admitting: Family

## 2018-06-15 ENCOUNTER — Ambulatory Visit (INDEPENDENT_AMBULATORY_CARE_PROVIDER_SITE_OTHER): Payer: Medicaid Other | Admitting: Pediatrics

## 2018-06-15 ENCOUNTER — Ambulatory Visit (INDEPENDENT_AMBULATORY_CARE_PROVIDER_SITE_OTHER): Payer: Medicaid Other | Admitting: Nurse Practitioner

## 2018-06-15 VITALS — BP 110/80 | HR 76

## 2018-06-15 VITALS — BP 110/80 | Wt <= 1120 oz

## 2018-06-15 DIAGNOSIS — Z431 Encounter for attention to gastrostomy: Secondary | ICD-10-CM | POA: Diagnosis not present

## 2018-06-15 DIAGNOSIS — R6250 Unspecified lack of expected normal physiological development in childhood: Secondary | ICD-10-CM | POA: Diagnosis not present

## 2018-06-15 DIAGNOSIS — Z0000 Encounter for general adult medical examination without abnormal findings: Secondary | ICD-10-CM | POA: Diagnosis not present

## 2018-06-15 DIAGNOSIS — R569 Unspecified convulsions: Secondary | ICD-10-CM

## 2018-06-15 DIAGNOSIS — Q043 Other reduction deformities of brain: Secondary | ICD-10-CM

## 2018-06-15 DIAGNOSIS — G40319 Generalized idiopathic epilepsy and epileptic syndromes, intractable, without status epilepticus: Secondary | ICD-10-CM

## 2018-06-15 DIAGNOSIS — R252 Cramp and spasm: Secondary | ICD-10-CM

## 2018-06-15 DIAGNOSIS — Z68.41 Body mass index (BMI) pediatric, 5th percentile to less than 85th percentile for age: Secondary | ICD-10-CM

## 2018-06-15 DIAGNOSIS — Z931 Gastrostomy status: Secondary | ICD-10-CM

## 2018-06-15 DIAGNOSIS — M4143 Neuromuscular scoliosis, cervicothoracic region: Secondary | ICD-10-CM

## 2018-06-15 DIAGNOSIS — Z23 Encounter for immunization: Secondary | ICD-10-CM | POA: Diagnosis not present

## 2018-06-15 MED ORDER — BACLOFEN 10 MG PO TABS: ORAL_TABLET | ORAL | 5 refills | Status: AC

## 2018-06-15 MED ORDER — ZONISAMIDE 25 MG PO CAPS: ORAL_CAPSULE | ORAL | 5 refills | Status: AC

## 2018-06-15 MED ORDER — LORAZEPAM 1 MG PO TABS: ORAL_TABLET | ORAL | 5 refills | Status: AC

## 2018-06-15 MED ORDER — DILANTIN INFATABS 50 MG PO CHEW: CHEWABLE_TABLET | ORAL | 5 refills | Status: AC

## 2018-06-15 MED ORDER — CLONAZEPAM 0.5 MG PO TABS: ORAL_TABLET | ORAL | 5 refills | Status: AC

## 2018-06-15 NOTE — Patient Instructions (Signed)
Thank you for coming in today.   Instructions for you until your next appointment are as follows: 1. Continue giving the medications as you have been doing. 2. Let me know if his seizures become more frequent or more severe 3. I will come to see Todd Murray at home in 1 month.  4. Please plan to return for follow up in one year or sooner if needed.

## 2018-06-15 NOTE — Progress Notes (Signed)
I had the pleasure of seeing Todd Murray and his mother in the surgery clinic today.  Rakwon is a(n) 23 y.o. male who comes to the clinic today for evaluation and consultation regarding:  C.C.: g-tube change  Richey is a 23 yo male with a history of Lissencephaly, seizures, blindness, scoliosis, and gastrostomy tube placement in 2002. Lennix has an 72 French 2.5 cm Mic-key balloon button. It was last changed 6 months ago. Eathyn is homebound and has significant difficulty coming to office appointments. He is generally seen in the office once a year and presented today for a joint visit with Elveria Rising, NP (neurology) and Dr. Barney Drain (PCP). Mother stated [place] recently lost all home health nursing care. Mother states the nurses have performed all g-tube changes over the past 19 years, but she will now need to perform all g-tube changes. Mother requested to have the g-tube changed today. Mother requested a recorded demonstration to use as reference. Mother brought her own g-tube button and confirms having an extra g-tube button at home.     Problem List/Medical History: Active Ambulatory Problems    Diagnosis Date Noted  . Development delay 02/19/2011  . Seizures (HCC) 02/19/2011  . 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 G tube feedings (HCC) 02/19/2011  . 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 Well child check 05/30/2015  . Encounter for routine child health examination with abnormal findings 06/01/2016  . Need for prophylactic vaccination and inoculation against influenza 06/01/2016  . BMI (body mass index), pediatric, 5% to less than 85% for age 12/01/2015   Resolved  Ambulatory Problems    Diagnosis Date Noted  . No Resolved Ambulatory Problems   Past Medical History:  Diagnosis Date  . ADHD (attention deficit hyperactivity disorder)   . Vision abnormalities     Surgical History: Past Surgical History:  Procedure Laterality Date  . GASTROSTOMY TUBE PLACEMENT      Family History: No family history on file.  Social History: Social History   Socioeconomic History  . Marital status: Single    Spouse name: Not on file  . Number of children: Not on file  . Years of education: Not on file  . Highest education level: Not on file  Occupational History  . Not on file  Social Needs  . Financial resource strain: Not on file  . Food insecurity:    Worry: Not on file    Inability: Not on file  . Transportation needs:    Medical: Not on file    Non-medical: Not on file  Tobacco Use  . Smoking status: Never Smoker  . Smokeless tobacco: Never Used  Substance and Sexual Activity  . Alcohol use: No  . Drug use: No  . Sexual activity: Never  Lifestyle  . Physical activity:    Days per week: Not on file    Minutes per session: Not on file  . Stress: Not on file  Relationships  . Social connections:    Talks on phone: Not on file    Gets together: Not on file    Attends religious service: Not on file    Active member of club or organization: Not on file    Attends meetings of clubs or organizations: Not on file  Relationship status: Not on file  . Intimate partner violence:    Fear of current or ex partner: Not on file    Emotionally abused: Not on file    Physically abused: Not on file    Forced sexual activity: Not on file  Other Topics Concern  . Not on file  Social History Narrative   [name] has a nurse aid that helps with his care. He has homebound services through [facility]. He lives with his mother and step-father.    Allergies: Allergies  Allergen Reactions  . Lamictal [Lamotrigine]      Medications: Current Outpatient Medications on File Prior to Visit  Medication Sig Dispense Refill  . acetaminophen (TYLENOL) 160 MG/5ML solution Place 15ml into g-tube every 4-6 hours as needed for pain or temperature greater than 100 axillary 120 mL 0  . albuterol (PROVENTIL) (2.5 MG/3ML) 0.083% nebulizer solution Take 3 mLs (2.5 mg total) by nebulization every 6 (six) hours as needed for wheezing or shortness of breath. 75 mL 12  . aluminum & magnesium hydroxide-simethicone (MYLANTA) 500-450-40 MG/5ML suspension Give 15-30 ml q 4-6hrs PRN reflux    . AMBULATORY NON FORMULARY MEDICATION Medication Name: Baclofen 10mg  - compound to 1mg /ml.  Give 2ml at 4PM daily. 60 mL 5  . budesonide (PULMICORT) 0.5 MG/2ML nebulizer solution Take 2 mLs (0.5 mg total) by nebulization 2 (two) times daily. 60 mL 12  . cetirizine HCl (ZYRTEC) 5 MG/5ML SYRP Give 10 ml in the morning and 5 ml in the evening via G-tube    . clonazePAM (KLONOPIN) 0.5 MG tablet Give Yohan 1/2 (one half) tablet via g-tube 4 times per day as directed 60 tablet 5  . DILANTIN INFATABS 50 MG tablet Give Nicholas 1 & 1/2 tablets every morning & evening & give 1 tablet at lunch & dinner every day via g-tube as directed. 150 tablet 5  . feeding supplement, PEDIASURE 1.0 CAL WITH FIBER, (PEDIASURE ENTERAL FORMULA 1.0 CAL WITH FIBER) LIQD Give 9 ounces by g-tube 4 times per day    . Glycerin, Laxative, (CVS GLYCERIN CHILD) 1 G SUPP 1 tab PRN 8 hours after prune juice    . glycopyrrolate (ROBINUL) 2 MG tablet Crush 1 tablet & give by g-tube 4 times a day as directed. 120 tablet 12  . ibuprofen (CHILDRENS IBUPROFEN) 100 MG/5ML suspension Place 15 mLs (300 mg total) into feeding tube as needed for fever. Place 15mls into g-tube every 4-6 hours as needed for pain or temperature greater than 100 axillary    . lansoprazole (PREVACID SOLUTAB) 15 MG disintegrating tablet Take 1 tablet into feeding tube daily. 30 tablet 12  . LORazepam (ATIVAN) 1 MG  tablet Give Tajon 1 to 2 tablets via g-tube at bedtime as needed for restlessness.  May give 1 tablet every 4-6 hours as needed for seizures. Not to exceed 6mg /day 90 tablet 5  . polyethylene glycol powder (GLYCOLAX/MIRALAX) powder Mix 8.5 gm in liquid of choice & give by gtube once a day as directed. hold if diarrhea. give 17gm as needed if no bowel movement in 2 days. 527 g 98  . Pseudoephedrine HCl (SUDAFED) 30 MG/5ML LIQD Give 10 ml q 4-6 hours PRN    . zonisamide (ZONEGRAN) 25 MG capsule TAKE 3 CAPSULES BY MOUTH TWICE DAILY AS DIRECTED 180 capsule 5   No current facility-administered medications on file prior to visit.     Review of Systems: Review of Systems  Constitutional: Negative.   Eyes:  Blind  Respiratory: Negative.   Cardiovascular: Negative.   Musculoskeletal:       Spasms  Skin:       Occasional irritation around tube  Neurological: Positive for seizures.      Vitals:    Physical Exam: Gen: awake, reclining in wheelchair, developmental delay, no acute distress  HEENT:Oral mucosa moist  Neck: Trachea midline Chest: Normal work of breathing Abdomen: soft, non-distended, non-tender, g-tube present in LUQ MSK: scoliosis Extremities: contractures of bilateral hands Neuro: non-verbal, hypotonia  Gastrostomy Tube: originally placed ~2002 Type of tube: Mic-key balloon button Tube Size: 18 French 2.5 cm, rotates easily Amount of water in balloon: 3.5 ml Tube Site: clean, dry, intact, no erythema or granulation tissue, no drainage   Recent Studies: None  Assessment/Impression and Plan: Vergie Livingicolas Ellerby is a 23 yo male with multiple medical problems and and gastrostomy tube dependency. [name] has an 3018 French cm AMT MiniOne balloon button that was exchanged for the same size without incident. The balloon was inflated with 5 ml tap water. Placement was confirmed with the aspiration of gastric contents. Suleman tolerated the procedure well. I provided a  detailed demonstration, which mother recorded on her phone. Mother confirms having a replacement button at home and does not need a prescription today. I discussed the importance of exchanging the button every 3 months and always having a back up at home. I discussed how to tell when the button is becoming too tight. Will assist with a referral to an adult surgeon for future g-tube management if necessary.     Iantha FallenMayah Dozier-Lineberger, FNP-C [facility]

## 2018-06-15 NOTE — Progress Notes (Signed)
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 He has intractable minor motor seizures with secondary generalization, profound developmental delay, cortical blindness, hypotonia, severe dysphagia, short stature, slow growth and neuromuscular scoliosis. He requires a gastrostomy tube for nourishment and medication. Remington requires care for others in all aspects of daily living. He has difficulty getting to medical appointments because of his fragile condition.  Today's concerns: Mom reports that Hasten has 2 or 3 brief seizures per week, with occasional motor seizures. With seizures, he becomes cyanotic and requires blow by oxygen to recover.  Mom reports that Aki has been otherwise healthy since he was last seen. He has occasional skin breakdown on his sacrum and his caregivers work to keep him repositioned and off the area as possible. Mom is also concerned because his g-tube has not been changed in some time and she is uncomfortable changing it herself. Briar used to have caregivers that changed the tube but he lost funding for that care when he turned 23 years old.  Review of systems: Please see HPI for neurologic and other pertinent review of systems. Otherwise all other systems were reviewed and were negative.  Problem List: Patient Active Problem List   Diagnosis Date Noted  . Encounter for routine child health examination with abnormal findings 06/01/2016  . Need for prophylactic vaccination and inoculation against influenza 06/01/2016  . BMI (body mass index), pediatric, 5% to less than 85% for age 05/02/2016  . Well  child check 05/30/2015  . Generalized convulsive epilepsy with intractable epilepsy ([place]) 11/12/2012  . 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 Generalized nonconvulsive epilepsy with intractable epilepsy ([place]) 11/12/2012  . Congenital reduction deformities of brain ([place]) 11/12/2012  . Development delay 02/19/2011  . Seizures ([place]) 02/19/2011  . Lissencephaly ([place]) 02/19/2011  . 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 G tube feedings ([place]) 02/19/2011  . 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 ADHD (attention deficit hyperactivity disorder)   . Seizures ([place])   . Vision abnormalities     Past medical history comments: See HPI Copied from previous record: Wilfrido has been on Dilantin since February 1999, Klonopin since December 1999, [place] since April 2001. 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  Surgical history: Past Surgical History:  Procedure Laterality Date  . GASTROSTOMY TUBE PLACEMENT       Family history: family history is not on file.   Social history: Social History   Socioeconomic History  . Marital status: Single    Spouse name: Not on file  . Number of children: Not on file  . Years of education: Not on file  . Highest education level: Not on file  Occupational History  . Not on file  Social Needs  . Financial resource strain: Not on file  . Food insecurity:    Worry: Not on file    Inability: Not on file  . Transportation needs:    Medical: Not on file    Non-medical: Not on file  Tobacco Use  .  Smoking status: Never Smoker  . Smokeless tobacco: Never Used  Substance and Sexual Activity  . Alcohol use: No  . Drug use: No  . Sexual activity: Never  Lifestyle  . Physical activity:    Days per week: Not on file    Minutes per session: Not on file  . Stress: Not  on file  Relationships  . Social connections:    Talks on phone: Not on file    Gets together: Not on file    Attends religious service: Not on file    Active member of club or organization: Not on file    Attends meetings of clubs or organizations: Not on file    Relationship status: Not on file  . Intimate partner violence:    Fear of current or ex partner: Not on file    Emotionally abused: Not on file    Physically abused: Not on file    Forced sexual activity: Not on file  Other Topics Concern  . Not on file  Social History Narrative   Draco has a nurse aid that helps with his care. He has homebound services through [employer]. He lives with his mother and step-father.    Allergies: Allergies  Allergen Reactions  . Lamictal [Lamotrigine]     Immunizations: Immunization History  Administered Date(s) Administered  . DTaP 04/04/1997, 07/22/1997, 12/20/1997, 11/19/2001  . Hepatitis B 02/04/97, 04/07/1997, 07/22/1997  . HiB (PRP-OMP) 04/07/1997, 07/22/1997, 12/20/1997, 02/20/1998  . IPV 04/07/1997, 07/22/1997, 02/20/1998, 11/19/2001  . Influenza Nasal 04/27/2008, 02/19/2011, 05/12/2012  . Influenza,inj,Quad PF,6+ Mos 05/29/2016, 05/26/2017, 06/15/2018  . Influenza,inj,quad, With Preservative 05/29/2015  . MMR 02/20/1998, 11/19/2001  . Meningococcal Conjugate 11/12/2008  . Pneumococcal Conjugate-13 11/19/2001  . Tdap 11/12/2008  . Varicella 11/19/2001    Physical Exam: BP 110/80   Pulse 76   General: small statured, thin young man, reclining in wheelchair, in no evident distress; blonde hair, blue eyes, non handed Head: microcephalic and atraumatic. Oropharynx benign. No dysmorphic features. Neck: supple with no carotid bruits. Cardiovascular: regular rate and rhythm, no murmurs. Respiratory: Clear to auscultation bilaterally Abdomen: Bowel sounds present all four quadrants, abdomen soft, non-tender, non-distended. No hepatosplenomegaly or masses  palpated.Gastrostomy tube in place. Musculoskeletal: No skeletal deformities or obvious scoliosis. Skin: no rashes or neurocutaneous lesions. His sacrum has an area of reddened but intact skin about 3 inches in diameter.   Neurologic Exam Mental Status: Profound cognitive impairment. Has no language. Does not respond or take notice of the examiner. Tolerant of invasions into his space Cranial Nerves: Fundoscopic exam - red reflex present.  Unable to fully visualize fundus. Has roving eye movements. Does not turn to localize faces and objects or sounds in the periphery. Occasionally startles to sounds in the periphery. Has lower facial weakness with drooling.  Neck flexion and extension abnormal with poor head control.  Motor: Severe hypotonia with very little spontaneous movements. Hands are fisted. Sensory: Withdrawal x 4 Coordination: Unable to adequately assess due to patient's inability to participate in examination. Does not reach for objects. Gait and Station: Unable to independently stand and bear weight.  Reflexes: Absent and symmetric. Toes neutral. No clonus  Impression: 1.  Congenital lissencephaly, primary frontal lobes with pachygyria posteriorly 2.  Generalized convulsive epilepsy with intractable epilepsy 3.  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  Severe developmental delay 5.  Cortical blindness 6.  Severe dysphagia 7.  Gastrostomy tube dependent 8.  Neuromuscular scoliosis  Recommendations for plan of care:  The patient's previus [facility] records were reviewed. [place] has neither had nor required imaging or lab studies since the last visit. 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 He is taking and tolerating Dilantin, Clonazepam and Zonegran for his seizure disorder. I arranged for Mayah Santa Lighter, NP with Pediatric  Surgery to come to the office to change his g-tube today. I talked with Mom about the reddened area on his sacrum and asked her to let me know if the appearance changes. I will investigate affordable preventative treatment such as Mepilex dressings and contact her. I will see Sunny at his home in about a month to recheck his sacrum. Mom agreed with the plans made today.  The medication list was reviewed and reconciled. No changes were made in the prescribed medications today. A complete medication list was provided to the patient's mother.  Allergies as of 06/15/2018      Reactions   Lamictal [lamotrigine]       Medication List       Accurate as of June 15, 2018 12:11 PM. Always use your most recent med list.        acetaminophen 160 MG/5ML solution Commonly known as:  TYLENOL Place 58m into g-tube every 4-6 hours as needed for pain or temperature greater than 100 axillary   albuterol (2.5 MG/3ML) 0.083% nebulizer solution Commonly known as:  PROVENTIL Take 3 mLs (2.5 mg total) by nebulization every 6 (six) hours as needed for wheezing or shortness of breath.   aluminum & magnesium hydroxide-simethicone 500-450-40 MG/5ML suspension Commonly known as:  MYLANTA Give 15-30 ml q 4-6hrs PRN reflux   AMBULATORY NON FORMULARY MEDICATION Medication Name: Baclofen 170m- compound to 74m44ml.  Give 2ml70m 4PM daily.   budesonide 0.5 MG/2ML nebulizer solution Commonly known as:  PULMICORT Take 2 mLs (0.5 mg total) by nebulization 2 (two) times daily.   cetirizine HCl 5 MG/5ML Syrp Commonly known as:  Zyrtec Give 10 ml in the morning and 5 ml in the evening via G-tube   clonazePAM 0.5 MG tablet Commonly known as:  KLONJoanette Gula (one half) tablet via g-tube 4 times per day as directed   DILANTIN INFATABS 50 MG tablet Generic drug:  phenytoin Give Nicholas 1 & 1/2 tablets every morning & evening & give 1 tablet at lunch & dinner every day via g-tube as directed.     feeding supplement (PEDIASURE 1.0 CAL WITH FIBER) Liqd Give 9 ounces by g-tube 4 times per day   Glycerin (Laxative) 1 g Supp Commonly known as:  CVS GLYCERIN CHILD 1 tab PRN 8 hours after prune juice   glycopyrrolate 2 MG tablet Commonly known as:  ROBINUL Crush 1 tablet & give by g-tube 4 times a day as directed.   ibuprofen 100 MG/5ML suspension Commonly known as:  CHILDRENS IBUPROFEN Place 15 mLs (300 mg total) into feeding tube as needed for fever. Place 15ml37mto g-tube every 4-6 hours as needed for pain or temperature greater than 100 axillary   lansoprazole 15 MG disintegrating tablet Commonly known as:  PREVACID SOLUTAB Take 1 tablet into feeding tube daily.   LORazepam 1 MG tablet Commonly known as:  ATIVAN Give Ranier 1 to 2 tablets via g-tube at bedtime as needed for restlessness.  May give 1 tablet every 4-6 hours as needed for seizures. Not to exceed 6mg/d89m  polyethylene glycol powder  powder Commonly known as:  GLYCOLAX/MIRALAX Mix 8.5 gm in liquid of choice & give by gtube once a day as directed. hold if diarrhea. give 17gm as needed if no bowel movement in 2 days.   Pseudoephedrine HCl 30 MG/5ML Liqd Commonly known as:  SUDAFED Give 10 ml q 4-6 hours PRN   zonisamide 25 MG capsule Commonly known as:  ZONEGRAN TAKE 3 CAPSULES BY MOUTH TWICE DAILY AS DIRECTED      Total time spent with the patient was 66mnutes, of which 50% or more was spent in counseling and coordination of care.  TRockwell GermanyNP-C [facility] Ph. [phone] [phone]

## 2018-06-16 ENCOUNTER — Encounter: Payer: Self-pay | Admitting: Pediatrics

## 2018-06-16 NOTE — Telephone Encounter (Signed)
Will get flu shot and will arrange to have tube changed

## 2018-06-17 ENCOUNTER — Encounter: Payer: Self-pay | Admitting: Pediatrics

## 2018-06-17 MED ORDER — MUPIROCIN 2 % EX OINT: TOPICAL_OINTMENT | CUTANEOUS | 12 refills | Status: AC

## 2018-06-17 MED ORDER — SILVER SULFADIAZINE 1 % EX CREA: 1.0000 "application " | TOPICAL_CREAM | Freq: Every day | CUTANEOUS | 12 refills | Status: AC

## 2018-06-17 NOTE — Progress Notes (Signed)
History of Present Illness Main concerns today are: Routine annual check up in special needs adult.    Developmental History Developmental delay  Function: Mobility: manual wheelchair Pain concerns: no Hand function:  Right: reduced dexterity  Left: reduced dexterity Spine curvature: mild Swallowing: normal and modified diet (pureed) Toileting: dependent   Equipment: wheelchair, bath chair, lift, suction tubes, suction machine, nebulizer, pulse ox, oxygen tubes/tank/diapers, wipes/gloves, G tube button, G tubings,  life system, lift and RAMP for house.   Nutrition--Pediasure Jr--36 oz per day Jerity 4oz per day  Full liquid diet  Specialty seen --Neurology--Dr Sharene SkeansHickling, Dental Dr Kathlene NovemberMike Review of Systems: Vision: impaired Hearing: impaired Seizures: yes - controlled Constipation: no  Fractures: no  The following portions of the patient's history were reviewed and updated as appropriate: allergies, current medications, past family history, past medical history, past social history, past surgical history and problem list.  Referrals needed-- Peds surgery to change G tube.  Objective:    Physical Exam [id number] Cognition: non-interactive Respiratory: normal, no increased effort Lower extremity function:  In wheelchair CVS--no murmurs Ears--normal G tube site --no infection Skin--no rash MSK--contractures Abdomen: normal-- G tube present Spine scoliosis: mild  Sitting Ability: assisted Gait: wheelchair    Assessment:    Developmental delay with seizures  Increased tone to lower extremities-    Plan:    1. Gross motor: delayed 2. Fine motor/ADL: delayed 3.Followed by Neurology 4. Transition skills: n/a 5. Speech/swallowing: no speech, GERD 6. Orthopedics/bracing: bilateral AFO's --present today

## 2018-06-17 NOTE — Patient Instructions (Signed)
Seizure, Adult °When you have a seizure: °· Parts of your body may move. °· You may have a change in how aware or awake (conscious) you are. °· You may shake (convulse). °Seizures usually last from 30 seconds to 2 minutes. Usually, they are not harmful unless they last a long time. °What are the signs or symptoms? °Common symptoms of this condition include: °· Shaking (convulsions). °· Stiffness in the body. °· Passing out (losing consciousness). °· Uncontrolled movements in the: °? Arms or legs. °? Eyes. °? Head. °? Mouth. °Some people have symptoms right before a seizure happens. These symptoms may include: °· Fear. °· Worry (anxiety). °· Feeling like you are going to throw up (nausea). °· Feeling like the room is spinning (vertigo). °· Feeling like you saw or heard something before (déjà vu). °· Odd tastes or smells. °· Changes in vision, such as seeing flashing lights or spots. °Follow these instructions at home: °Medicines ° °· Take over-the-counter and prescription medicines only as told by your doctor. °· Do not eat or drink anything that may keep your medicine from working, such as alcohol. °Activity °· Do not do any activities that would be dangerous if you had another seizure, like driving or swimming. Wait until your doctor says it is safe for you to do them. °· If you live in the [place] ask your local DMV (department of motor vehicles) when you can drive. °· Get plenty of rest. °Teaching others ° °· Teach friends and family what to do when you have a seizure. They should: °? Lay you on the ground. °? Protect your head and body. °? Loosen any tight clothing around your neck. °? Turn you on your side. °? Not hold you down. °? Not put anything into your mouth. °? Know whether or not you need emergency care. °? Stay with you until you are better. °General instructions °· Contact your doctor each time you have a seizure. °· Avoid anything that gives you seizures. °· Keep a seizure diary. Write down: °? What  you think caused each seizure. °? What you remember about each seizure. °· Keep all follow-up visits as told by your doctor. This is important. °Contact a doctor if: °· You have another seizure. °· You have seizures more often. °· There is any change in what happens during your seizures. °· You keep having seizures with treatment. °· You have symptoms of being sick or having an infection. °Get help right away if: °· You have a seizure: °? That lasts longer than 5 minutes. °? That is different than seizures you had before. °? That makes it harder to breathe. °? After you hurt your head. °· After a seizure, you cannot speak or use a part of your body. °· After a seizure, you are confused or have a bad headache. °· You have two or more seizures in a row. °· You are having seizures more often. °· You do not wake up right after a seizure. °· You get hurt during a seizure. °In an emergency: °· These symptoms may be an emergency. Do not wait to see if the symptoms will go away. Get medical help right away. Call your local emergency services (911 in the [place] Do not drive yourself to the hospital. °Summary °· Seizures usually last from 30 seconds to 2 minutes. Usually, they are not harmful unless they last a long time. °· Do not eat or drink anything that may keep your medicine from working, such as alcohol. °·   Teach friends and family what to do when you have a seizure. °· Contact your doctor each time you have a seizure. °This information is not intended to replace advice given to you by your health care provider. Make sure you discuss any questions you have with your health care provider. °Document Released: 11/13/2007 Document Revised: 02/18/2018 Document Reviewed: 07/03/2017 °Elsevier Interactive Patient Education © 2019 [employer] ° °

## 2018-06-18 ENCOUNTER — Encounter (INDEPENDENT_AMBULATORY_CARE_PROVIDER_SITE_OTHER): Payer: Self-pay | Admitting: Family

## 2018-06-22 ENCOUNTER — Other Ambulatory Visit (INDEPENDENT_AMBULATORY_CARE_PROVIDER_SITE_OTHER): Payer: Self-pay | Admitting: Family

## 2018-06-22 ENCOUNTER — Telehealth (INDEPENDENT_AMBULATORY_CARE_PROVIDER_SITE_OTHER): Payer: Self-pay | Admitting: Family

## 2018-06-22 DIAGNOSIS — L89159 Pressure ulcer of sacral region, unspecified stage: Secondary | ICD-10-CM

## 2018-06-22 DIAGNOSIS — Q043 Other reduction deformities of brain: Secondary | ICD-10-CM

## 2018-06-22 DIAGNOSIS — R6250 Unspecified lack of expected normal physiological development in childhood: Secondary | ICD-10-CM

## 2018-06-22 DIAGNOSIS — Z9189 Other specified personal risk factors, not elsewhere classified: Secondary | ICD-10-CM

## 2018-06-22 NOTE — Telephone Encounter (Signed)
°  Who's calling (name and relationship to patient) : Tiffany ([facility])  Best contact number: [phone] Provider they see: Inetta Fermo  Reason for call: Tiffany stated that they received wound care orders for pt. She stated they do not do wound care.

## 2018-06-22 NOTE — Telephone Encounter (Signed)
[facility] does not do wound care

## 2018-06-24 NOTE — Telephone Encounter (Signed)
Mom needs to talk to you about Todd Murray she thinks he has a sinus infection and a sore on his bottom.

## 2018-06-25 ENCOUNTER — Telehealth (INDEPENDENT_AMBULATORY_CARE_PROVIDER_SITE_OTHER): Payer: Self-pay | Admitting: Family

## 2018-06-25 MED ORDER — AMOXICILLIN-POT CLAVULANATE 600-42.9 MG/5ML PO SUSR: 900.0000 mg | Freq: Two times a day (BID) | ORAL | 0 refills | Status: AC

## 2018-06-25 NOTE — Telephone Encounter (Signed)
Called in Augmentin for sinus infection and advised mom to start Bactroban ointment twice a day for sores.  Mom expressed understanding and will follow as needed.Marland Kitchen

## 2018-06-25 NOTE — Telephone Encounter (Signed)
°  Who's calling (name and relationship to patient) : Rosalita ChessmanSuzanne (Mother) Best contact number: [phone] Provider they see: Inetta Fermoina  Reason for call: Mom would like Inetta Fermoina to return her call at her earliest convenience.

## 2018-06-26 ENCOUNTER — Encounter: Payer: Self-pay | Admitting: Pediatrics

## 2018-06-30 NOTE — Telephone Encounter (Signed)
L/M informing mom that we received her phone message. I apologized for not getting back with her in a timely matter. Invited her to call back to discuss her phone message.

## 2018-06-30 NOTE — Telephone Encounter (Signed)
I called and left a message for Mom requesting call back. TG

## 2018-06-30 NOTE — Telephone Encounter (Signed)
Mom called me back. She said that the pressure area on Sheppard' sacrum looked worse and she asked about the Mepilex dressing that I had recommended. I told Mom that I had sent the order to [facility] but they do not provide any dressing materials. She will check with his care manager to see where to send the order and may also try [facility]. I told Mom that he may need wound care nurse and asked her to send me a picture of the sacrum. She agreed with this plan. TG

## 2018-07-01 ENCOUNTER — Encounter: Payer: Self-pay | Admitting: Pediatrics

## 2018-07-06 ENCOUNTER — Encounter: Payer: Self-pay | Admitting: Pediatrics

## 2018-07-11 DEATH — deceased

## 2018-07-17 ENCOUNTER — Other Ambulatory Visit (INDEPENDENT_AMBULATORY_CARE_PROVIDER_SITE_OTHER): Payer: Medicaid Other | Admitting: Family
